# Patient Record
Sex: Male | Born: 2005 | Race: Black or African American | Hispanic: No | Marital: Single | Smoking: Never smoker
Health system: Southern US, Community
[De-identification: ages and names within clinical notes are randomized; demographics above are authoritative.]

## PROBLEM LIST (undated history)

## (undated) HISTORY — PX: NO PAST SURGERIES: SHX2092

---
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
## 2024-06-15 NOTE — Progress Notes (Signed)
 Pt has has sym since last Sunday
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
## 2024-07-31 NOTE — Progress Notes (Signed)
 Abstraction Result Flowsheet Data  This patient's last AWV date: : Not Found This patients last WCC/CPE date: : 08/18/2022   Reason for Encounter Reason for Encounter: Outreach Primary Reason for Outreach: [facility] Text Message: No MyChart Message: No Outreach Call Outcome: Scheduled

## 2024-08-13 ENCOUNTER — Other Ambulatory Visit: Payer: Self-pay

## 2024-08-13 ENCOUNTER — Emergency Department (HOSPITAL_COMMUNITY)

## 2024-08-13 ENCOUNTER — Emergency Department (HOSPITAL_COMMUNITY)
Admission: EM | Admit: 2024-08-13 | Discharge: 2024-08-14 | Disposition: A | Discharge status: Home / Self Care | Attending: Emergency Medicine | Admitting: Emergency Medicine

## 2024-08-13 ENCOUNTER — Encounter (HOSPITAL_COMMUNITY): Payer: Self-pay | Admitting: Emergency Medicine

## 2024-08-13 DIAGNOSIS — R519 Headache, unspecified: Secondary | ICD-10-CM | POA: Diagnosis not present

## 2024-08-13 DIAGNOSIS — Z7721 Contact with and (suspected) exposure to potentially hazardous body fluids: Secondary | ICD-10-CM | POA: Diagnosis not present

## 2024-08-13 DIAGNOSIS — M25532 Pain in left wrist: Secondary | ICD-10-CM | POA: Insufficient documentation

## 2024-08-13 DIAGNOSIS — S1093XA Contusion of unspecified part of neck, initial encounter: Secondary | ICD-10-CM | POA: Diagnosis not present

## 2024-08-13 DIAGNOSIS — M25531 Pain in right wrist: Secondary | ICD-10-CM | POA: Diagnosis not present

## 2024-08-13 DIAGNOSIS — M25512 Pain in left shoulder: Secondary | ICD-10-CM | POA: Insufficient documentation

## 2024-08-13 DIAGNOSIS — M25511 Pain in right shoulder: Secondary | ICD-10-CM | POA: Insufficient documentation

## 2024-08-13 DIAGNOSIS — S199XXA Unspecified injury of neck, initial encounter: Secondary | ICD-10-CM | POA: Diagnosis present

## 2024-08-13 MED ORDER — ACETAMINOPHEN 325 MG PO TABS
650.0000 mg | ORAL_TABLET | Freq: Once | ORAL | Status: AC
  Administered 2024-08-14: 650 mg via ORAL
  Filled 2024-08-13: qty 2

## 2024-08-13 MED ORDER — BICTEGRAVIR-EMTRICITAB-TENOFOV 50-200-25 MG PO TABS: 1.0000 | ORAL_TABLET | Freq: Every day | ORAL | 0 refills | Status: AC

## 2024-08-13 MED ORDER — IBUPROFEN 400 MG PO TABS
600.0000 mg | ORAL_TABLET | Freq: Once | ORAL | Status: AC
  Administered 2024-08-14: 600 mg via ORAL
  Filled 2024-08-13: qty 2

## 2024-08-13 MED ORDER — BICTEGRAVIR-EMTRICITAB-TENOFOV 50-200-25 MG PO TABS
1.0000 | ORAL_TABLET | Freq: Once | ORAL | Status: AC
  Administered 2024-08-14: 1 via ORAL
  Filled 2024-08-13: qty 1
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
## 2024-08-14 ENCOUNTER — Encounter (HOSPITAL_COMMUNITY): Payer: Self-pay

## 2024-08-14 ENCOUNTER — Emergency Department (HOSPITAL_COMMUNITY)
Admission: EM | Admit: 2024-08-14 | Discharge: 2024-08-14 | Disposition: A | Discharge status: Home / Self Care | Attending: Emergency Medicine | Admitting: Emergency Medicine

## 2024-08-14 DIAGNOSIS — M25511 Pain in right shoulder: Secondary | ICD-10-CM | POA: Insufficient documentation

## 2024-08-14 DIAGNOSIS — Y92149 Unspecified place in prison as the place of occurrence of the external cause: Secondary | ICD-10-CM | POA: Insufficient documentation

## 2024-08-14 DIAGNOSIS — R519 Headache, unspecified: Secondary | ICD-10-CM | POA: Insufficient documentation

## 2024-08-14 DIAGNOSIS — M25531 Pain in right wrist: Secondary | ICD-10-CM | POA: Insufficient documentation

## 2024-08-14 DIAGNOSIS — M25532 Pain in left wrist: Secondary | ICD-10-CM | POA: Insufficient documentation

## 2024-08-14 DIAGNOSIS — M25512 Pain in left shoulder: Secondary | ICD-10-CM | POA: Insufficient documentation

## 2024-08-14 DIAGNOSIS — M542 Cervicalgia: Secondary | ICD-10-CM | POA: Insufficient documentation

## 2024-08-14 DIAGNOSIS — Y990 Civilian activity done for income or pay: Secondary | ICD-10-CM | POA: Insufficient documentation

## 2024-08-14 LAB — COMPREHENSIVE METABOLIC PANEL WITH GFR
ALT: 37 U/L (ref 0–44)
AST: 34 U/L (ref 15–41)
Albumin: 4.8 g/dL (ref 3.5–5.0)
Alkaline Phosphatase: 64 U/L (ref 38–126)
Anion gap: 10 (ref 5–15)
BUN: 9 mg/dL (ref 6–20)
CO2: 26 mmol/L (ref 22–32)
Calcium: 9.9 mg/dL (ref 8.9–10.3)
Chloride: 102 mmol/L (ref 98–111)
Creatinine, Ser: 0.86 mg/dL (ref 0.61–1.24)
GFR, Estimated: >60 mL/min
Glucose, Bld: 96 mg/dL (ref 70–99)
Potassium: 3.9 mmol/L (ref 3.5–5.1)
Sodium: 138 mmol/L (ref 135–145)
Total Bilirubin: 0.6 mg/dL (ref 0.0–1.2)
Total Protein: 8.2 g/dL — ABNORMAL HIGH (ref 6.5–8.1)

## 2024-08-14 LAB — HEPATITIS C ANTIBODY: HCV Ab: NONREACTIVE

## 2024-08-14 LAB — RAPID HIV SCREEN (HIV 1/2 AB+AG)
HIV 1/2 Antibodies: NONREACTIVE
HIV-1 P24 Antigen - HIV24: NONREACTIVE

## 2024-08-14 LAB — SYPHILIS: RPR W/REFLEX TO RPR TITER AND TREPONEMAL ANTIBODIES, TRADITIONAL SCREENING AND DIAGNOSIS ALGORITHM: RPR Ser Ql: NONREACTIVE

## 2024-08-14 LAB — HEPATITIS B SURFACE ANTIGEN: Hepatitis B Surface Ag: NONREACTIVE

## 2024-08-14 MED ORDER — LIDOCAINE 5 % EX PTCH: 1.0000 | MEDICATED_PATCH | CUTANEOUS | 0 refills | Status: AC

## 2024-08-14 MED ORDER — METHOCARBAMOL 500 MG PO TABS: 500.0000 mg | ORAL_TABLET | Freq: Two times a day (BID) | ORAL | 0 refills | Status: AC

## 2024-08-14 MED ORDER — ACETAMINOPHEN 325 MG PO TABS
650.0000 mg | ORAL_TABLET | Freq: Once | ORAL | Status: AC
  Administered 2024-08-14: 650 mg via ORAL
  Filled 2024-08-14: qty 2

## 2024-08-14 NOTE — ED Notes (Signed)
 Discharge instructions reviewed with patient. Patient questions answered and opportunity for education reviewed. Patient voices understanding of discharge instructions with no further questions. Patient ambulatory with steady gait to lobby.

## 2024-08-14 NOTE — Discharge Instructions (Addendum)
 Take the medicine for post exposure prophylaxis for 1 month.  You should have follow-up testing for HIV at 6 weeks, 3 months, 6 months.  Above these are negative, you do not need additional testing, at least is related to this exposure.  Apply ice to sore areas.  Ice to be applied for 30 minutes at a time, 4 times a day.  You may take acetaminophen  and/or ibuprofen  as needed for pain.  Please be aware that if you combine acetaminophen  and ibuprofen , you will get better pain relief than you get from taking other medication by itself.
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
